# Patient Record
Sex: Male | Born: 1995 | Race: White | Hispanic: No | Marital: Single | Smoking: Never smoker
Health system: Southern US, Community
[De-identification: ages and names within clinical notes are randomized; demographics above are authoritative.]

## PROBLEM LIST (undated history)

## (undated) DIAGNOSIS — R079 Chest pain, unspecified: Secondary | ICD-10-CM

## (undated) HISTORY — DX: Chest pain, unspecified: R07.9

---
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
## 2014-05-25 ENCOUNTER — Emergency Department (HOSPITAL_COMMUNITY)
Admission: EM | Admit: 2014-05-25 | Discharge: 2014-05-25 | Disposition: A | Discharge status: Home / Self Care | Payer: Medicaid Other | Attending: Emergency Medicine | Admitting: Emergency Medicine

## 2014-05-25 ENCOUNTER — Encounter (HOSPITAL_COMMUNITY): Payer: Self-pay | Admitting: Emergency Medicine

## 2014-05-25 ENCOUNTER — Emergency Department (HOSPITAL_COMMUNITY): Payer: Medicaid Other

## 2014-05-25 DIAGNOSIS — T148XXA Other injury of unspecified body region, initial encounter: Secondary | ICD-10-CM

## 2014-05-25 DIAGNOSIS — S161XXA Strain of muscle, fascia and tendon at neck level, initial encounter: Secondary | ICD-10-CM | POA: Insufficient documentation

## 2014-05-25 DIAGNOSIS — Y9389 Activity, other specified: Secondary | ICD-10-CM | POA: Diagnosis not present

## 2014-05-25 DIAGNOSIS — Y92410 Unspecified street and highway as the place of occurrence of the external cause: Secondary | ICD-10-CM | POA: Insufficient documentation

## 2014-05-25 DIAGNOSIS — S199XXA Unspecified injury of neck, initial encounter: Secondary | ICD-10-CM | POA: Diagnosis present

## 2014-05-25 DIAGNOSIS — Y998 Other external cause status: Secondary | ICD-10-CM | POA: Insufficient documentation

## 2014-05-25 MED ORDER — HYDROCODONE-ACETAMINOPHEN 5-325 MG PO TABS: 1.0000 | ORAL_TABLET | ORAL | Status: AC | PRN

## 2014-05-25 MED ORDER — HYDROCODONE-ACETAMINOPHEN 5-325 MG PO TABS
1.0000 | ORAL_TABLET | Freq: Once | ORAL | Status: AC
  Administered 2014-05-25: 1 via ORAL
  Filled 2014-05-25: qty 1

## 2014-05-25 MED ORDER — CYCLOBENZAPRINE HCL 10 MG PO TABS
10.0000 mg | ORAL_TABLET | Freq: Once | ORAL | Status: AC
  Administered 2014-05-25: 10 mg via ORAL
  Filled 2014-05-25: qty 1

## 2014-05-25 MED ORDER — CYCLOBENZAPRINE HCL 10 MG PO TABS: 10.0000 mg | ORAL_TABLET | Freq: Two times a day (BID) | ORAL | Status: AC | PRN

## 2014-05-25 MED ORDER — IBUPROFEN 800 MG PO TABS: 800.0000 mg | ORAL_TABLET | Freq: Three times a day (TID) | ORAL | Status: AC

## 2014-05-25 NOTE — ED Notes (Signed)
Pt ambulatory upon DC. He reports that he is leaving with ALL belongings that he arrived with.
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
